# Patient Record
Sex: Male | Born: 1995 | Race: Black or African American | Hispanic: No | Marital: Single | Smoking: Never smoker
Health system: Southern US, Community
[De-identification: ages and names within clinical notes are randomized; demographics above are authoritative.]

## PROBLEM LIST (undated history)

## (undated) DIAGNOSIS — W3400XA Accidental discharge from unspecified firearms or gun, initial encounter: Secondary | ICD-10-CM

---
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
## 2017-08-03 ENCOUNTER — Ambulatory Visit (HOSPITAL_COMMUNITY)
Admission: EM | Admit: 2017-08-03 | Discharge: 2017-08-03 | Disposition: A | Discharge status: Home / Self Care | Payer: Medicaid Other | Attending: Family Medicine | Admitting: Family Medicine

## 2017-08-03 ENCOUNTER — Encounter (HOSPITAL_COMMUNITY): Payer: Self-pay

## 2017-08-03 DIAGNOSIS — Z113 Encounter for screening for infections with a predominantly sexual mode of transmission: Secondary | ICD-10-CM

## 2017-08-03 DIAGNOSIS — Z202 Contact with and (suspected) exposure to infections with a predominantly sexual mode of transmission: Secondary | ICD-10-CM | POA: Diagnosis present

## 2017-08-03 MED ORDER — ALBUTEROL SULFATE HFA 108 (90 BASE) MCG/ACT IN AERS: 1.0000 | INHALATION_SPRAY | Freq: Four times a day (QID) | RESPIRATORY_TRACT | 0 refills | Status: AC | PRN

## 2017-08-03 MED ORDER — AZITHROMYCIN 250 MG PO TABS
ORAL_TABLET | ORAL | Status: AC
  Filled 2017-08-03: qty 4

## 2017-08-03 MED ORDER — METRONIDAZOLE 500 MG PO TABS: 500.0000 mg | ORAL_TABLET | Freq: Two times a day (BID) | ORAL | 0 refills | Status: DC

## 2017-08-03 MED ORDER — AZITHROMYCIN 250 MG PO TABS
1000.0000 mg | ORAL_TABLET | Freq: Once | ORAL | Status: AC
  Administered 2017-08-03: 1000 mg via ORAL

## 2017-08-03 NOTE — Discharge Instructions (Signed)
You have been given the following medications today for treatment of suspected exposure to trichomonas and chlamydia:  azithromycin (ZITHROMAX) tablet 1,000 mg  Even though we have treated you today, we have sent testing for sexually transmitted infections. We will notify you of any positive results once they are received. If required, we will prescribe any medications you might need.  Please refrain from all sexual activity for at least the next seven days.

## 2017-08-03 NOTE — ED Provider Notes (Signed)
[facility]   [id number] 08/03/17 Arrival Time: 0940  ASSESSMENT & PLAN:  1. STD exposure       Discharge Instructions     You have been given the following medications today for treatment of suspected exposure to trichomonas and chlamydia:  azithromycin (ZITHROMAX) tablet 1,000 mg  Even though we have treated you today, we have sent testing for sexually transmitted infections. We will notify you of any positive results once they are received. If required, we will prescribe any medications you might need.  Please refrain from all sexual activity for at least the next seven days.     Pending: Urine cytology.   Will notify of any positive results. Instructed to refrain from sexual activity for at least seven days.  Reviewed expectations re: course of current medical issues. Questions answered. Outlined signs and symptoms indicating need for more acute intervention. Patient verbalized understanding. After Visit Summary given.   SUBJECTIVE:  Wesley Chapman is a 22 y.o. male who presents with reported possible exposure to Chlamydia and Trich. Urinary symptoms: none. No penile d/c. Afebrile. No abdominal or pelvic pain. No n/v. No rashes or lesions. Sexually active with single male partner.  ROS: As per HPI.  OBJECTIVE:  Vitals:   08/03/17 0952  BP: 123/75  Pulse: 72  Resp: 20  Temp: 98.2 F (36.8 C)  TempSrc: Oral  SpO2: 99%     General appearance: alert, cooperative, appears stated age and no distress Throat: lips, mucosa, and tongue normal; teeth and gums normal Back: no CVA tenderness Abdomen: soft, non-tender; bowel sounds normal; no masses or organomegaly; no guarding or rebound tenderness GU: declines Skin: warm and dry Psychological:  Alert and cooperative. Normal mood and affect.   Labs Reviewed - No data to display  No Known Allergies  Past Medical History:  Diagnosis Date  . Asthma    History reviewed. No pertinent family  history. Social History   Socioeconomic History  . Marital status: Single    Spouse name: Not on file  . Number of children: Not on file  . Years of education: Not on file  . Highest education level: Not on file  Occupational History  . Not on file  Social Needs  . Financial resource strain: Not on file  . Food insecurity:    Worry: Not on file    Inability: Not on file  . Transportation needs:    Medical: Not on file    Non-medical: Not on file  Tobacco Use  . Smoking status: Never Smoker  . Smokeless tobacco: Never Used  Substance and Sexual Activity  . Alcohol use: No  . Drug use: Yes    Types: Marijuana  . Sexual activity: Never  Lifestyle  . Physical activity:    Days per week: Not on file    Minutes per session: Not on file  . Stress: Not on file  Relationships  . Social connections:    Talks on phone: Not on file    Gets together: Not on file    Attends religious service: Not on file    Active member of club or organization: Not on file    Attends meetings of clubs or organizations: Not on file    Relationship status: Not on file  . Intimate partner violence:    Fear of current or ex partner: Not on file    Emotionally abused: Not on file    Physically abused: Not on file    Forced sexual  activity: Not on file  Other Topics Concern  . Not on file  Social History Narrative  . Not on file          Mardella Layman, MD 08/03/17 1024

## 2017-08-03 NOTE — ED Triage Notes (Signed)
STD Exposure

## 2017-08-04 ENCOUNTER — Telehealth (HOSPITAL_COMMUNITY): Payer: Self-pay

## 2017-08-04 LAB — URINE CYTOLOGY ANCILLARY ONLY
CHLAMYDIA, DNA PROBE: POSITIVE — AB
NEISSERIA GONORRHEA: NEGATIVE
Trichomonas: NEGATIVE

## 2017-08-04 NOTE — Telephone Encounter (Signed)
Chlamydia is positive.  This was treated at the urgent care visit with po zithromax 1g.  Pt contacted and made aware of results. Educated pt to please refrain from sexual intercourse for 7 days to give the medicine time to work.  Sexual partners need to be notified and tested/treated.  Condoms may reduce risk of reinfection.  Recheck or followup with PCP for further evaluation if symptoms are not improving.  [facility] notified 

## 2018-01-26 ENCOUNTER — Emergency Department (HOSPITAL_COMMUNITY): Payer: Medicaid Other

## 2018-01-26 ENCOUNTER — Other Ambulatory Visit: Payer: Self-pay

## 2018-01-26 ENCOUNTER — Encounter (HOSPITAL_COMMUNITY): Payer: Self-pay | Admitting: Emergency Medicine

## 2018-01-26 ENCOUNTER — Observation Stay (HOSPITAL_COMMUNITY)
Admission: EM | Admit: 2018-01-26 | Discharge: 2018-01-27 | Disposition: A | Discharge status: Home / Self Care | Payer: Medicaid Other | Attending: Surgery | Admitting: Surgery

## 2018-01-26 DIAGNOSIS — S31639A Puncture wound without foreign body of abdominal wall, unspecified quadrant with penetration into peritoneal cavity, initial encounter: Principal | ICD-10-CM | POA: Insufficient documentation

## 2018-01-26 DIAGNOSIS — R1031 Right lower quadrant pain: Secondary | ICD-10-CM | POA: Diagnosis present

## 2018-01-26 DIAGNOSIS — Z791 Long term (current) use of non-steroidal anti-inflammatories (NSAID): Secondary | ICD-10-CM | POA: Diagnosis not present

## 2018-01-26 DIAGNOSIS — Y249XXA Unspecified firearm discharge, undetermined intent, initial encounter: Secondary | ICD-10-CM

## 2018-01-26 DIAGNOSIS — S31139A Puncture wound of abdominal wall without foreign body, unspecified quadrant without penetration into peritoneal cavity, initial encounter: Secondary | ICD-10-CM

## 2018-01-26 DIAGNOSIS — Z79899 Other long term (current) drug therapy: Secondary | ICD-10-CM | POA: Diagnosis not present

## 2018-01-26 DIAGNOSIS — R58 Hemorrhage, not elsewhere classified: Secondary | ICD-10-CM | POA: Diagnosis present

## 2018-01-26 DIAGNOSIS — S71131A Puncture wound without foreign body, right thigh, initial encounter: Secondary | ICD-10-CM | POA: Insufficient documentation

## 2018-01-26 DIAGNOSIS — W3400XA Accidental discharge from unspecified firearms or gun, initial encounter: Secondary | ICD-10-CM | POA: Diagnosis not present

## 2018-01-26 HISTORY — DX: Accidental discharge from unspecified firearms or gun, initial encounter: W34.00XA

## 2018-01-26 LAB — CBC
HCT: 45.1 % (ref 39.0–52.0)
Hemoglobin: 15.4 g/dL (ref 13.0–17.0)
MCH: 29.7 pg (ref 26.0–34.0)
MCHC: 34.1 g/dL (ref 30.0–36.0)
MCV: 86.9 fL (ref 80.0–100.0)
Platelets: 302 10*3/uL (ref 150–400)
RBC: 5.19 MIL/uL (ref 4.22–5.81)
RDW: 12.7 % (ref 11.5–15.5)
WBC: 12.9 10*3/uL — ABNORMAL HIGH (ref 4.0–10.5)
nRBC: 0 % (ref 0.0–0.2)

## 2018-01-26 LAB — I-STAT CHEM 8, ED
BUN: 13 mg/dL (ref 6–20)
Calcium, Ion: 1.1 mmol/L — ABNORMAL LOW (ref 1.15–1.40)
Chloride: 104 mmol/L (ref 98–111)
Creatinine, Ser: 1.2 mg/dL (ref 0.61–1.24)
Glucose, Bld: 125 mg/dL — ABNORMAL HIGH (ref 70–99)
HCT: 46 % (ref 39.0–52.0)
HEMOGLOBIN: 15.6 g/dL (ref 13.0–17.0)
Potassium: 3.2 mmol/L — ABNORMAL LOW (ref 3.5–5.1)
Sodium: 140 mmol/L (ref 135–145)
TCO2: 24 mmol/L (ref 22–32)

## 2018-01-26 LAB — COMPREHENSIVE METABOLIC PANEL
ALT: 20 U/L (ref 0–44)
AST: 22 U/L (ref 15–41)
Albumin: 4.3 g/dL (ref 3.5–5.0)
Alkaline Phosphatase: 76 U/L (ref 38–126)
Anion gap: 10 (ref 5–15)
BUN: 12 mg/dL (ref 6–20)
CALCIUM: 9.4 mg/dL (ref 8.9–10.3)
CO2: 23 mmol/L (ref 22–32)
Chloride: 106 mmol/L (ref 98–111)
Creatinine, Ser: 1.4 mg/dL — ABNORMAL HIGH (ref 0.61–1.24)
GFR calc non Af Amer: >60 mL/min (ref >60)
Glucose, Bld: 130 mg/dL — ABNORMAL HIGH (ref 70–99)
Potassium: 3.2 mmol/L — ABNORMAL LOW (ref 3.5–5.1)
SODIUM: 139 mmol/L (ref 135–145)
TOTAL PROTEIN: 7.1 g/dL (ref 6.5–8.1)
Total Bilirubin: 0.9 mg/dL (ref 0.3–1.2)

## 2018-01-26 LAB — CDS SEROLOGY

## 2018-01-26 LAB — I-STAT CG4 LACTIC ACID, ED: Lactic Acid, Venous: 3.09 mmol/L (ref 0.5–1.9)

## 2018-01-26 LAB — PROTIME-INR
INR: 1.19
Prothrombin Time: 15 seconds (ref 11.4–15.2)

## 2018-01-26 LAB — ETHANOL: Alcohol, Ethyl (B): <10 mg/dL (ref <10)

## 2018-01-26 MED ORDER — MORPHINE SULFATE (PF) 4 MG/ML IV SOLN
4.0000 mg | Freq: Once | INTRAVENOUS | Status: AC
  Administered 2018-01-26: 4 mg via INTRAVENOUS
  Filled 2018-01-26: qty 1

## 2018-01-26 MED ORDER — IOPAMIDOL (ISOVUE-370) INJECTION 76%
INTRAVENOUS | Status: AC
  Filled 2018-01-26: qty 100

## 2018-01-26 MED ORDER — IOPAMIDOL (ISOVUE-370) INJECTION 76%
100.0000 mL | Freq: Once | INTRAVENOUS | Status: AC | PRN
  Administered 2018-01-26: 100 mL via INTRAVENOUS

## 2018-01-26 MED ORDER — TETANUS-DIPHTH-ACELL PERTUSSIS 5-2.5-18.5 LF-MCG/0.5 IM SUSP
0.5000 mL | Freq: Once | INTRAMUSCULAR | Status: AC
  Administered 2018-01-26: 0.5 mL via INTRAMUSCULAR
  Filled 2018-01-26: qty 0.5

## 2018-01-26 NOTE — Progress Notes (Signed)
I responded to a Level 1 Trauma page from the ED. I visited [facility], patient was unavailable and no family present. I remained present to provide support to the staff as needed or requested.     01/26/18 2300  Clinical Encounter Type  Visited With Patient not available  Visit Type Spiritual support;Code;ED  Referral From Nurse  Consult/Referral To Chaplain  Spiritual Encounters  Spiritual Needs Prayer     Chaplain Dr Melvyn Novas
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
## 2018-01-27 ENCOUNTER — Encounter (HOSPITAL_COMMUNITY): Payer: Self-pay | Admitting: General Practice

## 2018-01-27 DIAGNOSIS — W3400XA Accidental discharge from unspecified firearms or gun, initial encounter: Secondary | ICD-10-CM

## 2018-01-27 LAB — BPAM FFP
BLOOD PRODUCT EXPIRATION DATE: 202001262359
Blood Product Expiration Date: 202001272359
ISSUE DATE / TIME: 202001142231
ISSUE DATE / TIME: 202001142231
UNIT TYPE AND RH: 6200
Unit Type and Rh: 6200

## 2018-01-27 LAB — TYPE AND SCREEN
ABO/RH(D): O POS
Antibody Screen: NEGATIVE
UNIT DIVISION: 0
Unit division: 0

## 2018-01-27 LAB — PREPARE FRESH FROZEN PLASMA
UNIT DIVISION: 0
Unit division: 0

## 2018-01-27 LAB — BASIC METABOLIC PANEL
Anion gap: 9 (ref 5–15)
BUN: 8 mg/dL (ref 6–20)
CO2: 23 mmol/L (ref 22–32)
Calcium: 8.7 mg/dL — ABNORMAL LOW (ref 8.9–10.3)
Chloride: 106 mmol/L (ref 98–111)
Creatinine, Ser: 1 mg/dL (ref 0.61–1.24)
GFR calc Af Amer: >60 mL/min (ref >60)
GFR calc non Af Amer: >60 mL/min (ref >60)
Glucose, Bld: 110 mg/dL — ABNORMAL HIGH (ref 70–99)
Potassium: 3.5 mmol/L (ref 3.5–5.1)
Sodium: 138 mmol/L (ref 135–145)

## 2018-01-27 LAB — CBC
HCT: 38.2 % — ABNORMAL LOW (ref 39.0–52.0)
Hemoglobin: 12.8 g/dL — ABNORMAL LOW (ref 13.0–17.0)
MCH: 28.8 pg (ref 26.0–34.0)
MCHC: 33.5 g/dL (ref 30.0–36.0)
MCV: 85.8 fL (ref 80.0–100.0)
PLATELETS: 236 10*3/uL (ref 150–400)
RBC: 4.45 MIL/uL (ref 4.22–5.81)
RDW: 12.7 % (ref 11.5–15.5)
WBC: 10.8 10*3/uL — AB (ref 4.0–10.5)
nRBC: 0 % (ref 0.0–0.2)

## 2018-01-27 LAB — BLOOD PRODUCT ORDER (VERBAL) VERIFICATION

## 2018-01-27 LAB — BPAM RBC
BLOOD PRODUCT EXPIRATION DATE: 202002132359
Blood Product Expiration Date: 202002122359
ISSUE DATE / TIME: 202001142231
ISSUE DATE / TIME: 202001142231
Unit Type and Rh: 5100
Unit Type and Rh: 5100

## 2018-01-27 LAB — HIV ANTIBODY (ROUTINE TESTING W REFLEX): HIV Screen 4th Generation wRfx: NONREACTIVE

## 2018-01-27 LAB — ABO/RH: ABO/RH(D): O POS

## 2018-01-27 MED ORDER — HYDRALAZINE HCL 20 MG/ML IJ SOLN: 10.0000 mg | INTRAMUSCULAR | Status: DC | PRN

## 2018-01-27 MED ORDER — OXYCODONE HCL 5 MG PO TABS
5.0000 mg | ORAL_TABLET | ORAL | Status: DC | PRN
  Administered 2018-01-27 (×3): 5 mg via ORAL
  Filled 2018-01-27 (×3): qty 1

## 2018-01-27 MED ORDER — ONDANSETRON 4 MG PO TBDP: 4.0000 mg | ORAL_TABLET | Freq: Four times a day (QID) | ORAL | Status: DC | PRN

## 2018-01-27 MED ORDER — ENOXAPARIN SODIUM 40 MG/0.4ML ~~LOC~~ SOLN
40.0000 mg | SUBCUTANEOUS | Status: DC
  Filled 2018-01-27 (×2): qty 0.4

## 2018-01-27 MED ORDER — DEXTROSE-NACL 5-0.9 % IV SOLN
INTRAVENOUS | Status: DC
  Administered 2018-01-27: 02:00:00 via INTRAVENOUS

## 2018-01-27 MED ORDER — HYDROMORPHONE HCL 1 MG/ML IJ SOLN: 1.0000 mg | INTRAMUSCULAR | Status: DC | PRN

## 2018-01-27 MED ORDER — ONDANSETRON HCL 4 MG/2ML IJ SOLN: 4.0000 mg | Freq: Four times a day (QID) | INTRAMUSCULAR | Status: DC | PRN

## 2018-01-27 MED ORDER — OXYCODONE HCL 5 MG PO TABS: 5.0000 mg | ORAL_TABLET | Freq: Four times a day (QID) | ORAL | 0 refills | Status: DC | PRN
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
## 2018-01-27 NOTE — Progress Notes (Signed)
Orthopedic Tech Progress Note Patient Details:  Wesley Chapman [birth year] [id number]  Ortho Devices Type of Ortho Device: Crutches Ortho Device/Splint Interventions: Application   Post Interventions Patient Tolerated: Well, Ambulated well Instructions Provided: Care of device   Saul Fordyce 01/27/2018, 4:35 PM
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
## 2018-02-01 ENCOUNTER — Other Ambulatory Visit: Payer: Self-pay

## 2018-02-01 ENCOUNTER — Emergency Department (HOSPITAL_COMMUNITY)
Admission: EM | Admit: 2018-02-01 | Discharge: 2018-02-01 | Disposition: A | Discharge status: Home / Self Care | Payer: Medicaid Other | Attending: Emergency Medicine | Admitting: Emergency Medicine

## 2018-02-01 DIAGNOSIS — M79604 Pain in right leg: Secondary | ICD-10-CM | POA: Insufficient documentation

## 2018-02-01 DIAGNOSIS — Z5189 Encounter for other specified aftercare: Secondary | ICD-10-CM | POA: Diagnosis not present
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
## 2018-02-01 NOTE — ED Notes (Signed)
Patient verbalizes understanding of discharge instructions. Opportunity for questioning and answers were provided. Armband removed by staff, pt discharged from ED ambulatory on crutches.   

## 2018-02-01 NOTE — Discharge Instructions (Addendum)
Pain medication is not refilled in the emergency department.  You will need to contact you doctor.  Return for fever, discharge.
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
## 2019-01-02 ENCOUNTER — Other Ambulatory Visit: Payer: Self-pay

## 2019-01-02 ENCOUNTER — Encounter (HOSPITAL_COMMUNITY): Payer: Self-pay | Admitting: *Deleted

## 2019-01-02 ENCOUNTER — Ambulatory Visit (HOSPITAL_COMMUNITY)
Admission: EM | Admit: 2019-01-02 | Discharge: 2019-01-02 | Disposition: A | Discharge status: Home / Self Care | Payer: Medicaid Other | Attending: Urgent Care | Admitting: Urgent Care

## 2019-01-02 DIAGNOSIS — Z202 Contact with and (suspected) exposure to infections with a predominantly sexual mode of transmission: Secondary | ICD-10-CM | POA: Diagnosis not present

## 2019-01-02 DIAGNOSIS — A549 Gonococcal infection, unspecified: Secondary | ICD-10-CM

## 2019-01-02 DIAGNOSIS — A749 Chlamydial infection, unspecified: Secondary | ICD-10-CM

## 2019-01-02 MED ORDER — AZITHROMYCIN 250 MG PO TABS
1000.0000 mg | ORAL_TABLET | Freq: Once | ORAL | Status: AC
  Administered 2019-01-02: 13:00:00 1000 mg via ORAL

## 2019-01-02 MED ORDER — AZITHROMYCIN 250 MG PO TABS
ORAL_TABLET | ORAL | Status: AC
  Filled 2019-01-02: qty 4

## 2019-01-02 MED ORDER — CEFTRIAXONE SODIUM 250 MG IJ SOLR
INTRAMUSCULAR | Status: AC
  Filled 2019-01-02: qty 250

## 2019-01-02 MED ORDER — CEFTRIAXONE SODIUM 250 MG IJ SOLR
250.0000 mg | Freq: Once | INTRAMUSCULAR | Status: AC
  Administered 2019-01-02: 13:00:00 250 mg via INTRAMUSCULAR
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
## 2019-01-02 NOTE — Discharge Instructions (Signed)
Avoid all forms of sexual intercourse (oral, vaginal, anal) for the next 7 days to avoid spreading/reinfecting. Return if symptoms worsen/do not resolve, you develop fever, abdominal pain, blood in your urine, or are re-exposed to an STI.  
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
## 2023-12-28 ENCOUNTER — Ambulatory Visit (HOSPITAL_COMMUNITY)
Admission: EM | Admit: 2023-12-28 | Discharge: 2023-12-28 | Disposition: A | Discharge status: Home / Self Care | Attending: Internal Medicine | Admitting: Internal Medicine

## 2023-12-28 ENCOUNTER — Encounter (HOSPITAL_COMMUNITY): Payer: Self-pay | Admitting: *Deleted

## 2023-12-28 DIAGNOSIS — H60392 Other infective otitis externa, left ear: Secondary | ICD-10-CM

## 2023-12-28 MED ORDER — CIPROFLOXACIN-DEXAMETHASONE 0.3-0.1 % OT SUSP: 4.0000 [drp] | Freq: Two times a day (BID) | OTIC | 0 refills | Status: AC

## 2023-12-28 NOTE — Discharge Instructions (Signed)
 You have an ear infection of the ear canal known as otitis externa. Use ear drops as prescribed for 7 days. Do not place anything smaller than elbow deep into ear canal- this includes Q-tips. Place a cotton ball into the external ear canal while in the shower to avoid getting water into the ears.  You may place a small amount of rubbing alcohol onto the end of a Q-tip and place this into the outer ear canal to dry up any remaining water that may have gotten into the ear while showering or submerging head underwater to prevent this type of infection in the future.   If you develop any new or worsening symptoms or if your symptoms do not start to improve, please return here or follow-up with your primary care provider. If your symptoms are severe, please go to the emergency room.

## 2023-12-28 NOTE — ED Triage Notes (Signed)
 C/O starting with short bouts of sensation of clogged left ear x 2 days. Denies any pain. Has tried cleaning ear with Qtip.
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
